# Patient Record
Sex: Male | Born: 1950 | Race: Black or African American | Hispanic: No | Marital: Single | Smoking: Current every day smoker
Health system: Southern US, Community
[De-identification: ages and names within clinical notes are randomized; demographics above are authoritative.]

## PROBLEM LIST (undated history)

## (undated) DIAGNOSIS — F064 Anxiety disorder due to known physiological condition: Secondary | ICD-10-CM

## (undated) DIAGNOSIS — F1010 Alcohol abuse, uncomplicated: Secondary | ICD-10-CM

## (undated) DIAGNOSIS — S04049A Injury of visual cortex, unspecified eye, initial encounter: Secondary | ICD-10-CM

## (undated) DIAGNOSIS — R011 Cardiac murmur, unspecified: Secondary | ICD-10-CM

## (undated) DIAGNOSIS — I10 Essential (primary) hypertension: Secondary | ICD-10-CM

## (undated) DIAGNOSIS — M1710 Unilateral primary osteoarthritis, unspecified knee: Secondary | ICD-10-CM

## (undated) DIAGNOSIS — M62838 Other muscle spasm: Secondary | ICD-10-CM

## (undated) DIAGNOSIS — M179 Osteoarthritis of knee, unspecified: Secondary | ICD-10-CM

## (undated) DIAGNOSIS — Z720 Tobacco use: Secondary | ICD-10-CM

## (undated) DIAGNOSIS — G2581 Restless legs syndrome: Secondary | ICD-10-CM

## (undated) DIAGNOSIS — E669 Obesity, unspecified: Secondary | ICD-10-CM

## (undated) DIAGNOSIS — E785 Hyperlipidemia, unspecified: Secondary | ICD-10-CM

## (undated) DIAGNOSIS — I499 Cardiac arrhythmia, unspecified: Secondary | ICD-10-CM

## (undated) DIAGNOSIS — G8921 Chronic pain due to trauma: Secondary | ICD-10-CM

## (undated) DIAGNOSIS — R5383 Other fatigue: Secondary | ICD-10-CM

## (undated) DIAGNOSIS — M25561 Pain in right knee: Secondary | ICD-10-CM

## (undated) DIAGNOSIS — Z630 Problems in relationship with spouse or partner: Secondary | ICD-10-CM

## (undated) DIAGNOSIS — R7989 Other specified abnormal findings of blood chemistry: Secondary | ICD-10-CM

## (undated) HISTORY — DX: Injury of visual cortex, unspecified side, initial encounter: S04.049A

## (undated) HISTORY — DX: Problems in relationship with spouse or partner: Z63.0

## (undated) HISTORY — DX: Cardiac arrhythmia, unspecified: I49.9

## (undated) HISTORY — DX: Osteoarthritis of knee, unspecified: M17.9

## (undated) HISTORY — DX: Tobacco use: Z72.0

## (undated) HISTORY — DX: Alcohol abuse, uncomplicated: F10.10

## (undated) HISTORY — DX: Restless legs syndrome: G25.81

## (undated) HISTORY — DX: Other muscle spasm: M62.838

## (undated) HISTORY — DX: Anxiety disorder due to known physiological condition: F06.4

## (undated) HISTORY — DX: Obesity, unspecified: E66.9

## (undated) HISTORY — DX: Cardiac murmur, unspecified: R01.1

## (undated) HISTORY — DX: Pain in right knee: M25.561

## (undated) HISTORY — DX: Hyperlipidemia, unspecified: E78.5

## (undated) HISTORY — DX: Other fatigue: R53.83

## (undated) HISTORY — DX: Chronic pain due to trauma: G89.21

## (undated) HISTORY — DX: Unilateral primary osteoarthritis, unspecified knee: M17.10

## (undated) HISTORY — DX: Other specified abnormal findings of blood chemistry: R79.89

---

## 2003-09-27 ENCOUNTER — Emergency Department (HOSPITAL_COMMUNITY): Admission: EM | Admit: 2003-09-27 | Discharge: 2003-09-27 | Payer: Self-pay | Admitting: Emergency Medicine

## 2012-06-07 ENCOUNTER — Ambulatory Visit (HOSPITAL_COMMUNITY)
Admission: RE | Admit: 2012-06-07 | Discharge: 2012-06-07 | Disposition: A | Discharge status: Home / Self Care | Payer: Self-pay | Source: Ambulatory Visit | Attending: Internal Medicine | Admitting: Internal Medicine

## 2012-06-07 ENCOUNTER — Other Ambulatory Visit (HOSPITAL_COMMUNITY): Payer: Self-pay | Admitting: Internal Medicine

## 2012-06-07 DIAGNOSIS — R0602 Shortness of breath: Secondary | ICD-10-CM | POA: Insufficient documentation

## 2013-03-23 ENCOUNTER — Emergency Department (HOSPITAL_COMMUNITY)
Admission: EM | Admit: 2013-03-23 | Discharge: 2013-03-23 | Disposition: A | Discharge status: Home / Self Care | Payer: Medicaid Other | Attending: Emergency Medicine | Admitting: Emergency Medicine

## 2013-03-23 ENCOUNTER — Encounter (HOSPITAL_COMMUNITY): Payer: Self-pay | Admitting: *Deleted

## 2013-03-23 DIAGNOSIS — I10 Essential (primary) hypertension: Secondary | ICD-10-CM | POA: Insufficient documentation

## 2013-03-23 DIAGNOSIS — S4980XA Other specified injuries of shoulder and upper arm, unspecified arm, initial encounter: Secondary | ICD-10-CM | POA: Insufficient documentation

## 2013-03-23 DIAGNOSIS — IMO0002 Reserved for concepts with insufficient information to code with codable children: Secondary | ICD-10-CM | POA: Insufficient documentation

## 2013-03-23 DIAGNOSIS — Y92410 Unspecified street and highway as the place of occurrence of the external cause: Secondary | ICD-10-CM | POA: Insufficient documentation

## 2013-03-23 DIAGNOSIS — S46909A Unspecified injury of unspecified muscle, fascia and tendon at shoulder and upper arm level, unspecified arm, initial encounter: Secondary | ICD-10-CM | POA: Insufficient documentation

## 2013-03-23 DIAGNOSIS — F17200 Nicotine dependence, unspecified, uncomplicated: Secondary | ICD-10-CM | POA: Insufficient documentation

## 2013-03-23 DIAGNOSIS — S8990XA Unspecified injury of unspecified lower leg, initial encounter: Secondary | ICD-10-CM | POA: Insufficient documentation

## 2013-03-23 DIAGNOSIS — Y9389 Activity, other specified: Secondary | ICD-10-CM | POA: Insufficient documentation

## 2013-03-23 HISTORY — DX: Essential (primary) hypertension: I10

## 2013-03-23 MED ORDER — CYCLOBENZAPRINE HCL 10 MG PO TABS: 10.0000 mg | ORAL_TABLET | Freq: Two times a day (BID) | ORAL | Status: DC | PRN

## 2013-03-23 MED ORDER — NAPROXEN 500 MG PO TABS: 500.0000 mg | ORAL_TABLET | Freq: Two times a day (BID) | ORAL | Status: DC
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
## 2013-03-24 NOTE — ED Provider Notes (Signed)
Medical screening examination/treatment/procedure(s) were performed by non-physician practitioner and as supervising physician I was immediately available for consultation/collaboration.   [name], MD 03/24/13 [phone]

## 2015-10-03 ENCOUNTER — Emergency Department (HOSPITAL_COMMUNITY)
Admission: EM | Admit: 2015-10-03 | Discharge: 2015-10-03 | Disposition: A | Discharge status: Home / Self Care | Payer: Medicare Other | Attending: Emergency Medicine | Admitting: Emergency Medicine

## 2015-10-03 ENCOUNTER — Encounter (HOSPITAL_COMMUNITY): Payer: Self-pay | Admitting: *Deleted

## 2015-10-03 ENCOUNTER — Emergency Department (HOSPITAL_COMMUNITY): Payer: Medicare Other

## 2015-10-03 DIAGNOSIS — Y998 Other external cause status: Secondary | ICD-10-CM | POA: Diagnosis not present

## 2015-10-03 DIAGNOSIS — I10 Essential (primary) hypertension: Secondary | ICD-10-CM | POA: Insufficient documentation

## 2015-10-03 DIAGNOSIS — S4992XA Unspecified injury of left shoulder and upper arm, initial encounter: Secondary | ICD-10-CM | POA: Insufficient documentation

## 2015-10-03 DIAGNOSIS — S060X0A Concussion without loss of consciousness, initial encounter: Secondary | ICD-10-CM | POA: Diagnosis not present

## 2015-10-03 DIAGNOSIS — Y92002 Bathroom of unspecified non-institutional (private) residence single-family (private) house as the place of occurrence of the external cause: Secondary | ICD-10-CM | POA: Insufficient documentation

## 2015-10-03 DIAGNOSIS — Z79899 Other long term (current) drug therapy: Secondary | ICD-10-CM | POA: Insufficient documentation

## 2015-10-03 DIAGNOSIS — Y9389 Activity, other specified: Secondary | ICD-10-CM | POA: Diagnosis not present

## 2015-10-03 DIAGNOSIS — S0990XA Unspecified injury of head, initial encounter: Secondary | ICD-10-CM | POA: Diagnosis present

## 2015-10-03 DIAGNOSIS — F17210 Nicotine dependence, cigarettes, uncomplicated: Secondary | ICD-10-CM | POA: Insufficient documentation

## 2015-10-03 DIAGNOSIS — Z791 Long term (current) use of non-steroidal anti-inflammatories (NSAID): Secondary | ICD-10-CM | POA: Insufficient documentation

## 2015-10-03 DIAGNOSIS — S161XXA Strain of muscle, fascia and tendon at neck level, initial encounter: Secondary | ICD-10-CM | POA: Insufficient documentation

## 2015-10-03 DIAGNOSIS — W19XXXA Unspecified fall, initial encounter: Secondary | ICD-10-CM

## 2015-10-03 DIAGNOSIS — W1812XA Fall from or off toilet with subsequent striking against object, initial encounter: Secondary | ICD-10-CM | POA: Diagnosis not present

## 2015-10-03 MED ORDER — CYCLOBENZAPRINE HCL 10 MG PO TABS: 10.0000 mg | ORAL_TABLET | Freq: Two times a day (BID) | ORAL | Status: DC | PRN

## 2015-10-03 MED ORDER — NAPROXEN 375 MG PO TABS: 375.0000 mg | ORAL_TABLET | Freq: Two times a day (BID) | ORAL | Status: DC

## 2015-10-03 MED ORDER — TRAMADOL HCL 50 MG PO TABS: 50.0000 mg | ORAL_TABLET | Freq: Two times a day (BID) | ORAL | Status: DC | PRN

## 2015-10-03 MED ORDER — CYCLOBENZAPRINE HCL 10 MG PO TABS: 10.0000 mg | ORAL_TABLET | Freq: Three times a day (TID) | ORAL | Status: DC | PRN

## 2015-10-03 NOTE — Discharge Instructions (Signed)
Concussion, Adult °A concussion, or closed-head injury, is a brain injury caused by a direct blow to the head or by a quick and sudden movement (jolt) of the head or neck. Concussions are usually not life-threatening. Even so, the effects of a concussion can be serious. If you have had a concussion before, you are more likely to experience concussion-like symptoms after a direct blow to the head.  °CAUSES °· Direct blow to the head, such as from running into another player during a soccer game, being hit in a fight, or hitting your head on a hard surface. °· A jolt of the head or neck that causes the brain to move back and forth inside the skull, such as in a car crash. °SIGNS AND SYMPTOMS °The signs of a concussion can be hard to notice. Early on, they may be missed by you, family members, and health care providers. You may look fine but act or feel differently. °Symptoms are usually temporary, but they may last for days, weeks, or even longer. Some symptoms may appear right away while others may not show up for hours or days. Every head injury is different. Symptoms include: °· Mild to moderate headaches that will not go away. °· A feeling of pressure inside your head. °· Having more trouble than usual: °¨ Learning or remembering things you have heard. °¨ Answering questions. °¨ Paying attention or concentrating. °¨ Organizing daily tasks. °¨ Making decisions and solving problems. °· Slowness in thinking, acting or reacting, speaking, or reading. °· Getting lost or being easily confused. °· Feeling tired all the time or lacking energy (fatigued). °· Feeling drowsy. °· Sleep disturbances. °¨ Sleeping more than usual. °¨ Sleeping less than usual. °¨ Trouble falling asleep. °¨ Trouble sleeping (insomnia). °· Loss of balance or feeling lightheaded or dizzy. °· Nausea or vomiting. °· Numbness or tingling. °· Increased sensitivity to: °¨ Sounds. °¨ Lights. °¨ Distractions. °· Vision problems or eyes that tire  easily. °· Diminished sense of taste or smell. °· Ringing in the ears. °· Mood changes such as feeling sad or anxious. °· Becoming easily irritated or angry for little or no reason. °· Lack of motivation. °· Seeing or hearing things other people do not see or hear (hallucinations). °DIAGNOSIS °Your health care provider can usually diagnose a concussion based on a description of your injury and symptoms. He or she will ask whether you passed out (lost consciousness) and whether you are having trouble remembering events that happened right before and during your injury. °Your evaluation might include: °· A brain scan to look for signs of injury to the brain. Even if the test shows no injury, you may still have a concussion. °· Blood tests to be sure other problems are not present. °TREATMENT °· Concussions are usually treated in an emergency department, in urgent care, or at a clinic. You may need to stay in the hospital overnight for further treatment. °· Tell your health care provider if you are taking any medicines, including prescription medicines, over-the-counter medicines, and natural remedies. Some medicines, such as blood thinners (anticoagulants) and aspirin, may increase the chance of complications. Also tell your health care provider whether you have had alcohol or are taking illegal drugs. This information may affect treatment. °· Your health care provider will send you home with important instructions to follow. °· How fast you will recover from a concussion depends on many factors. These factors include how severe your concussion is, what part of your brain was injured,   your age, and how healthy you were before the concussion. °· Most people with mild injuries recover fully. Recovery can take time. In general, recovery is slower in older persons. Also, persons who have had a concussion in the past or have other medical problems may find that it takes longer to recover from their current injury. °HOME  CARE INSTRUCTIONS °General Instructions °· Carefully follow the directions your health care provider gave you. °· Only take over-the-counter or prescription medicines for pain, discomfort, or fever as directed by your health care provider. °· Take only those medicines that your health care provider has approved. °· Do not drink alcohol until your health care provider says you are well enough to do so. Alcohol and certain other drugs may slow your recovery and can put you at risk of further injury. °· If it is harder than usual to remember things, write them down. °· If you are easily distracted, try to do one thing at a time. For example, do not try to watch TV while fixing dinner. °· Talk with family members or close friends when making important decisions. °· Keep all follow-up appointments. Repeated evaluation of your symptoms is recommended for your recovery. °· Watch your symptoms and tell others to do the same. Complications sometimes occur after a concussion. Older adults with a brain injury may have a higher risk of serious complications, such as a blood clot on the brain. °· Tell your teachers, school nurse, school counselor, coach, athletic trainer, or work manager about your injury, symptoms, and restrictions. Tell them about what you can or cannot do. They should watch for: °¨ Increased problems with attention or concentration. °¨ Increased difficulty remembering or learning new information. °¨ Increased time needed to complete tasks or assignments. °¨ Increased irritability or decreased ability to cope with stress. °¨ Increased symptoms. °· Rest. Rest helps the brain to heal. Make sure you: °¨ Get plenty of sleep at night. Avoid staying up late at night. °¨ Keep the same bedtime hours on weekends and weekdays. °¨ Rest during the day. Take daytime naps or rest breaks when you feel tired. °· Limit activities that require a lot of thought or concentration. These include: °¨ Doing homework or job-related  work. °¨ Watching TV. °¨ Working on the computer. °· Avoid any situation where there is potential for another head injury (football, hockey, soccer, basketball, martial arts, downhill snow sports and horseback riding). Your condition will get worse every time you experience a concussion. You should avoid these activities until you are evaluated by the appropriate follow-up health care providers. °Returning To Your Regular Activities °You will need to return to your normal activities slowly, not all at once. You must give your body and brain enough time for recovery. °· Do not return to sports or other athletic activities until your health care provider tells you it is safe to do so. °· Ask your health care provider when you can drive, ride a bicycle, or operate heavy machinery. Your ability to react may be slower after a brain injury. Never do these activities if you are dizzy. °· Ask your health care provider about when you can return to work or school. °Preventing Another Concussion °It is very important to avoid another brain injury, especially before you have recovered. In rare cases, another injury can lead to permanent brain damage, brain swelling, or death. The risk of this is greatest during the first 7-10 days after a head injury. Avoid injuries by: °· Wearing a   seat belt when riding in a car.  Drinking alcohol only in moderation.  Wearing a helmet when biking, skiing, skateboarding, skating, or doing similar activities.  Avoiding activities that could lead to a second concussion, such as contact or recreational sports, until your health care provider says it is okay.  Taking safety measures in your home.  Remove clutter and tripping hazards from floors and stairways.  Use grab bars in bathrooms and handrails by stairs.  Place non-slip mats on floors and in bathtubs.  Improve lighting in dim areas. SEEK MEDICAL CARE IF:  You have increased problems paying attention or  concentrating.  You have increased difficulty remembering or learning new information.  You need more time to complete tasks or assignments than before.  You have increased irritability or decreased ability to cope with stress.  You have more symptoms than before. Seek medical care if you have any of the following symptoms for more than 2 weeks after your injury:  Lasting (chronic) headaches.  Dizziness or balance problems.  Nausea.  Vision problems.  Increased sensitivity to noise or light.  Depression or mood swings.  Anxiety or irritability.  Memory problems.  Difficulty concentrating or paying attention.  Sleep problems.  Feeling tired all the time. SEEK IMMEDIATE MEDICAL CARE IF:  You have severe or worsening headaches. These may be a sign of a blood clot in the brain.  You have weakness (even if only in one hand, leg, or part of the face).  You have numbness.  You have decreased coordination.  You vomit repeatedly.  You have increased sleepiness.  One pupil is larger than the other.  You have convulsions.  You have slurred speech.  You have increased confusion. This may be a sign of a blood clot in the brain.  You have increased restlessness, agitation, or irritability.  You are unable to recognize people or places.  You have neck pain.  It is difficult to wake you up.  You have unusual behavior changes.  You lose consciousness. MAKE SURE YOU:  Understand these instructions.  Will watch your condition.  Will get help right away if you are not doing well or get worse.   This information is not intended to replace advice given to you by your health care provider. Make sure you discuss any questions you have with your health care provider.   Document Released: 11/15/2003 Document Revised: 09/15/2014 Document Reviewed: 03/17/2013 [employer] Interactive Patient Education 2016 [employer]  Muscle Strain A muscle strain is an injury that  occurs when a muscle is stretched beyond its normal length. Usually a small number of muscle fibers are torn when this happens. Muscle strain is rated in degrees. First-degree strains have the least amount of muscle fiber tearing and pain. Second-degree and third-degree strains have increasingly more tearing and pain.  Usually, recovery from muscle strain takes 1-2 weeks. Complete healing takes 5-6 weeks.  CAUSES  Muscle strain happens when a sudden, violent force placed on a muscle stretches it too far. This may occur with lifting, sports, or a fall.  RISK FACTORS Muscle strain is especially common in athletes.  SIGNS AND SYMPTOMS At the site of the muscle strain, there may be:  Pain.  Bruising.  Swelling.  Difficulty using the muscle due to pain or lack of normal function. DIAGNOSIS  Your health care provider will perform a physical exam and ask about your medical history. TREATMENT  Often, the best treatment for a muscle strain is resting, icing, and  applying cold compresses to the injured area.  HOME CARE INSTRUCTIONS   Use the PRICE method of treatment to promote muscle healing during the first 2-3 days after your injury. The PRICE method involves:  Protecting the muscle from being injured again.  Restricting your activity and resting the injured body part.  Icing your injury. To do this, put ice in a plastic bag. Place a towel between your skin and the bag. Then, apply the ice and leave it on from 15-20 minutes each hour. After the third day, switch to moist heat packs.  Apply compression to the injured area with a splint or elastic bandage. Be careful not to wrap it too tightly. This may interfere with blood circulation or increase swelling.  Elevate the injured body part above the level of your heart as often as you can.  Only take over-the-counter or prescription medicines for pain, discomfort, or fever as directed by your health care provider.  Warming up prior to  exercise helps to prevent future muscle strains. SEEK MEDICAL CARE IF:   You have increasing pain or swelling in the injured area.  You have numbness, tingling, or a significant loss of strength in the injured area. MAKE SURE YOU:   Understand these instructions.  Will watch your condition.  Will get help right away if you are not doing well or get worse.   This information is not intended to replace advice given to you by your health care provider. Make sure you discuss any questions you have with your health care provider.   Document Released: 08/25/2005 Document Revised: 06/15/2013 Document Reviewed: 03/24/2013 [employer] Interactive Patient Education [employer]
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
## 2015-10-03 NOTE — ED Notes (Signed)
He has been taking ibu  That helps

## 2017-01-22 IMAGING — CT CT HEAD W/O CM
2 series · 16 of 30 positions shown, 20 images · non-contrast
Comparison: None.

CLINICAL DATA: Pain with blurred vision following fall

EXAM:
CT HEAD WITHOUT CONTRAST
TECHNIQUE: Contiguous axial images were obtained from the base of the skull
through the vertex without intravenous contrast.

[Series 201: head w/o, idose (1) · axial · non-contrast · 0.43mm/px · z∈[+1009,+1144]mm · 13 of 33 slices shown, 17 images]
[im 3/33  brain]
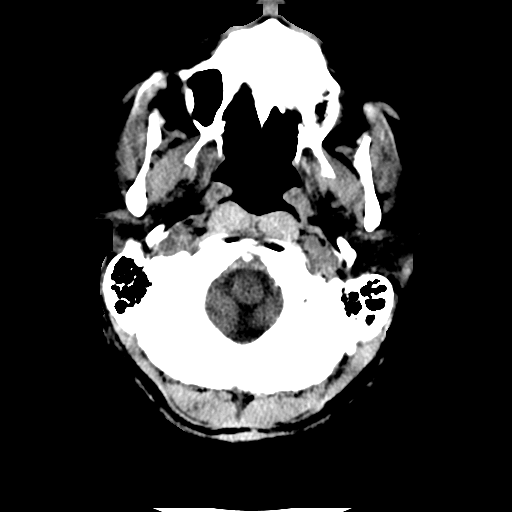
[im 3/33  bone]
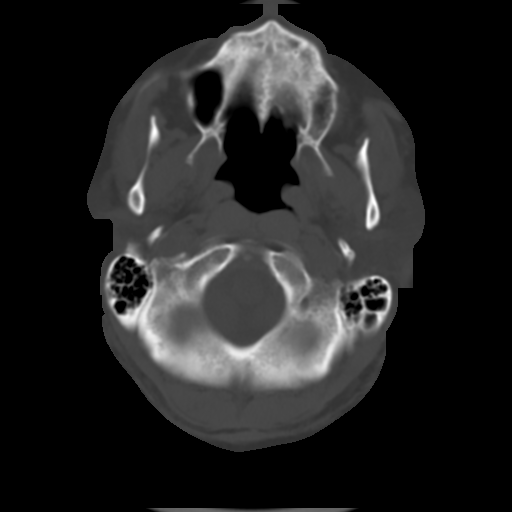
[im 5/33  brain]
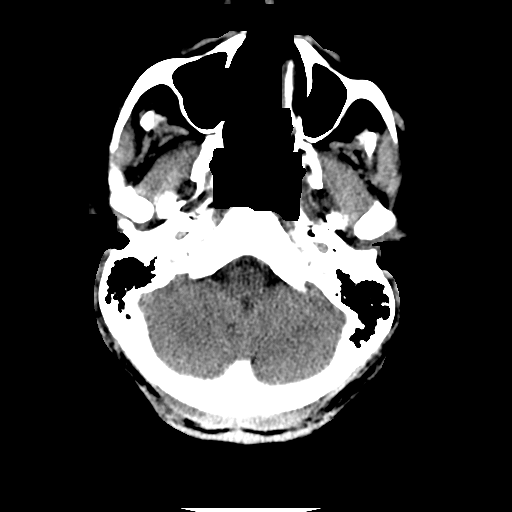
[im 7/33  brain]
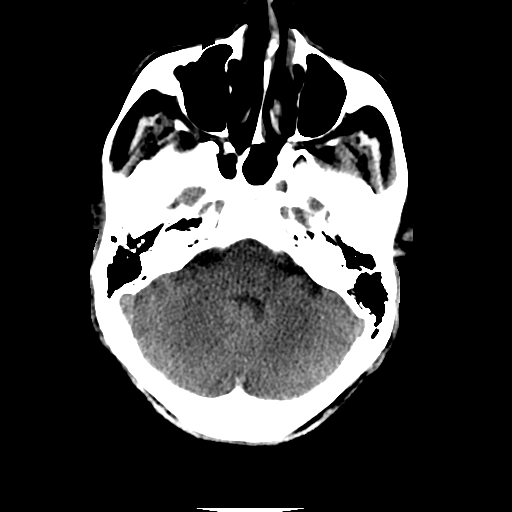
[im 10/33  brain]
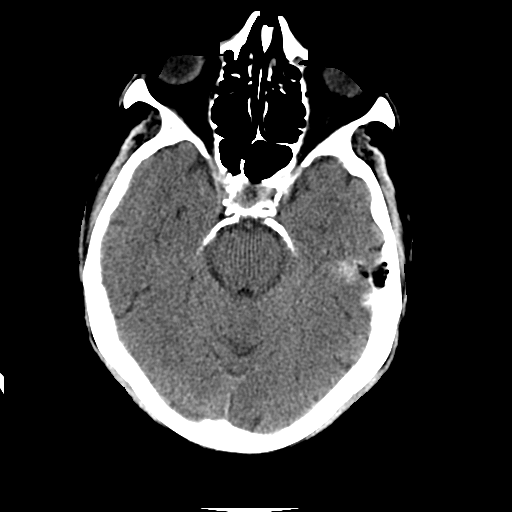
[im 12/33  brain]
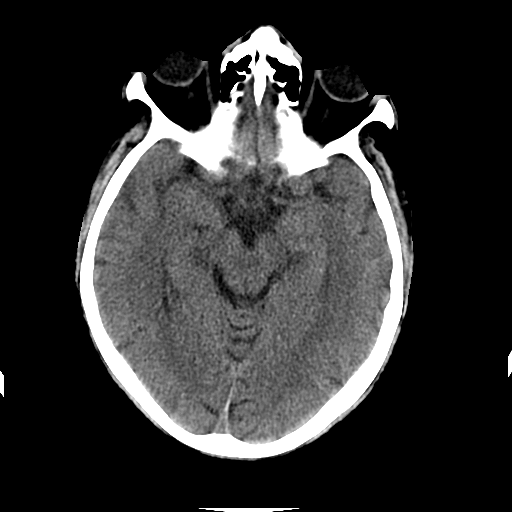
[im 12/33  bone]
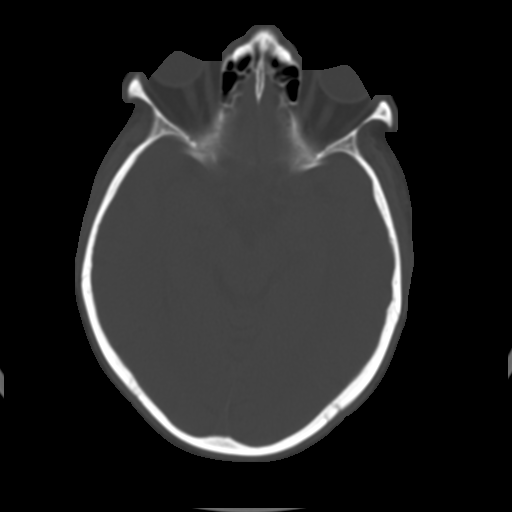
[im 14/33  brain]
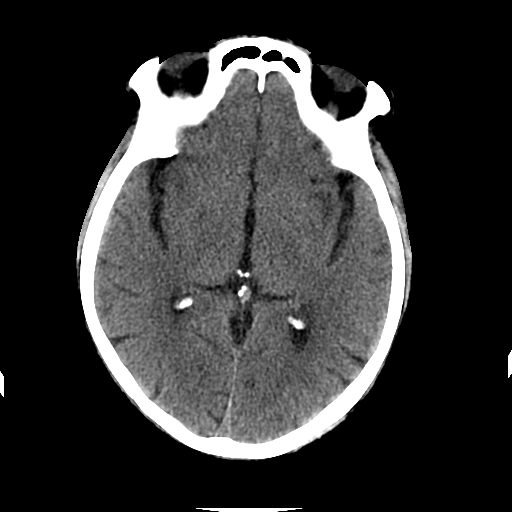
[im 17/33  brain]
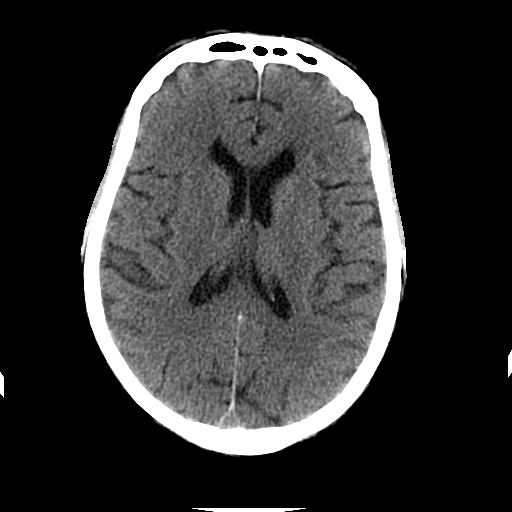
[im 19/33  brain]
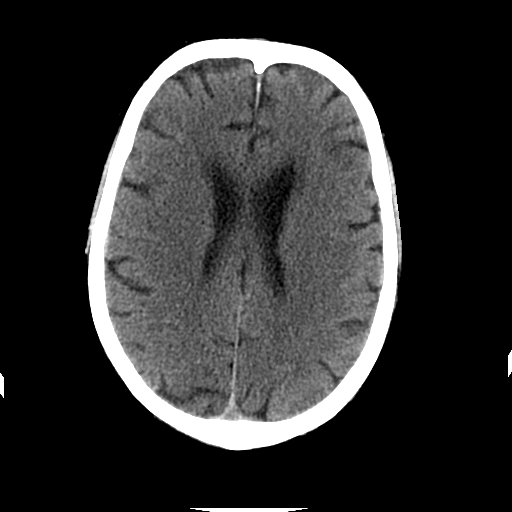
[im 21/33  brain]
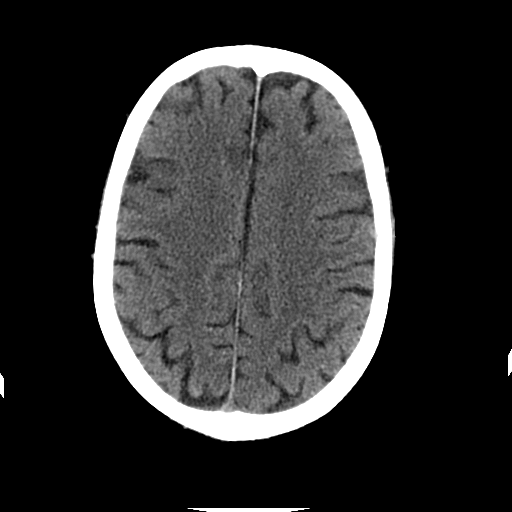
[im 21/33  bone]
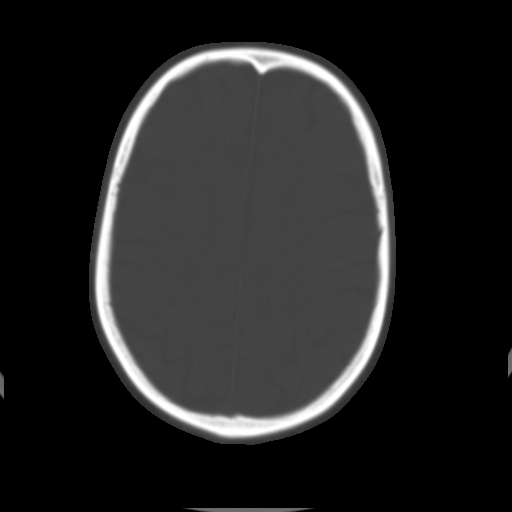
[im 23/33  brain]
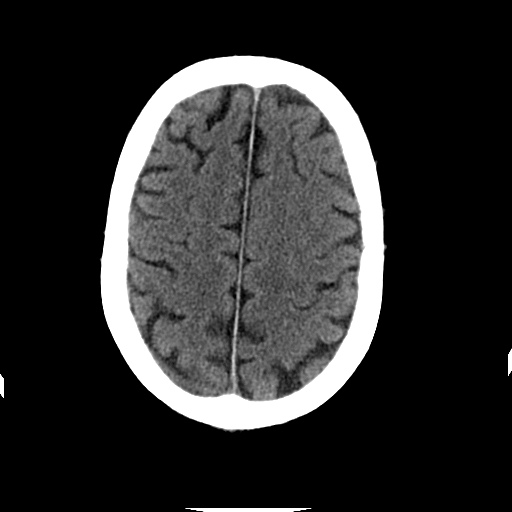
[im 26/33  brain]
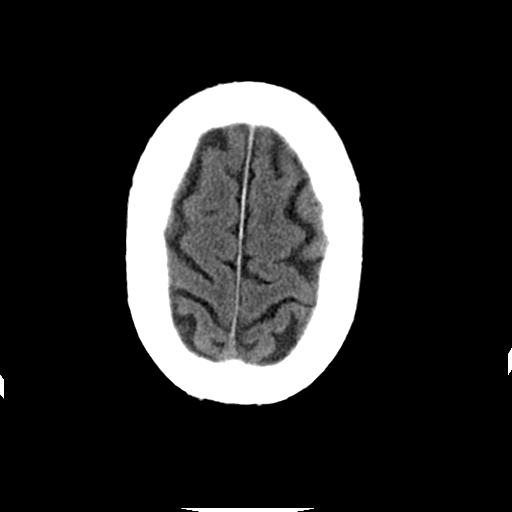
[im 28/33  brain]
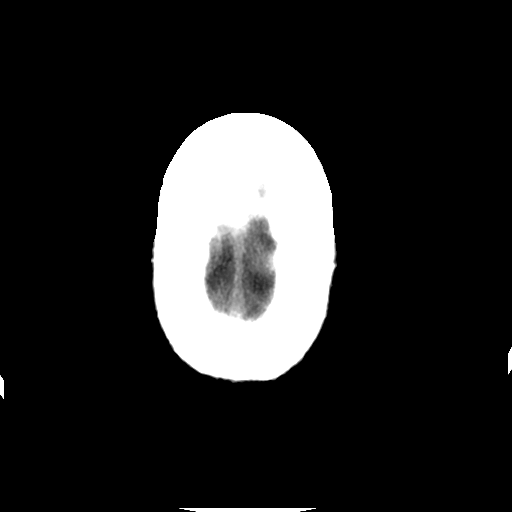
[im 30/33  brain]
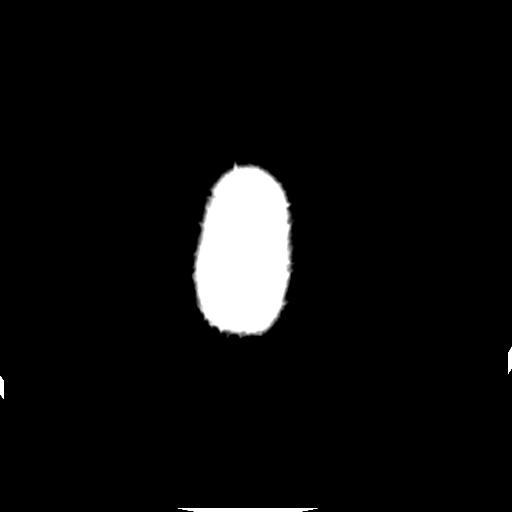
[im 30/33  bone]
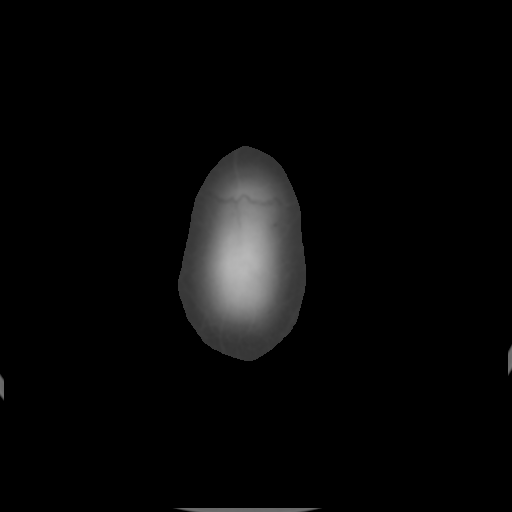

[Series 202: head w/o bone, idose (1) · axial · non-contrast · 0.43mm/px · z∈[+1009,+1054]mm · 3 of 33 slices shown]
[im 3/33  bone]
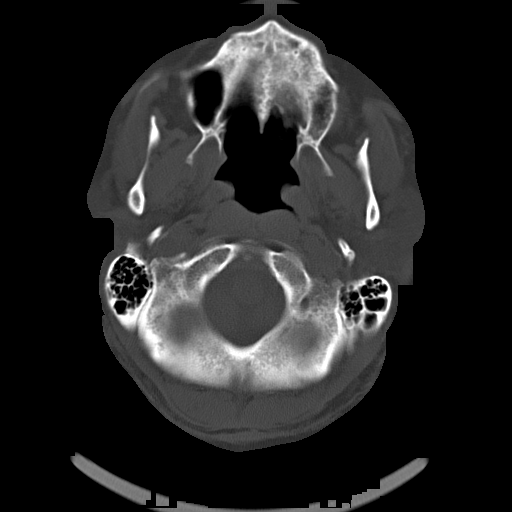
[im 7/33  bone]
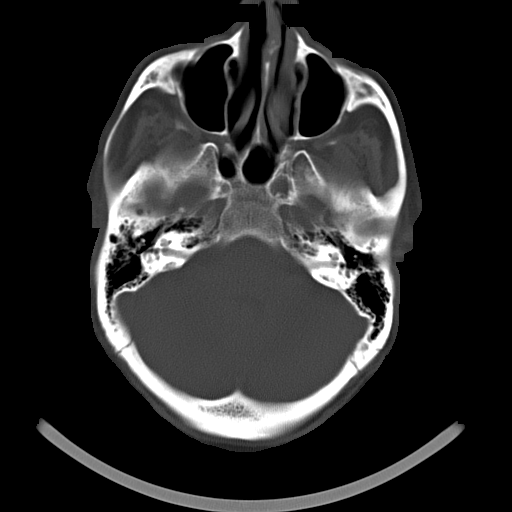
[im 12/33  bone]
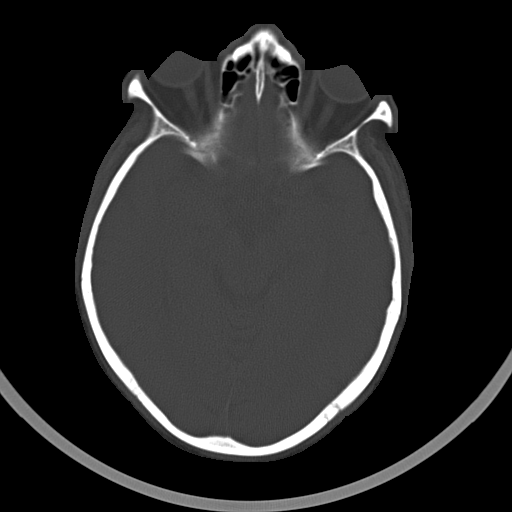

[16 of 30 positions shown; findings below may reference images not displayed]

FINDINGS: The ventricles are normal in size and configuration for age. There
is no intracranial mass, hemorrhage, extra-axial fluid collection,
or midline shift. There is minimal small vessel disease in the
centra semiovale bilaterally. Elsewhere, gray-white compartments
appear normal. No acute infarct evident. The bony calvarium appears
intact. The mastoid air cells are clear. No intraorbital lesions are
appreciated.
IMPRESSION: Minimal periventricular small vessel disease. No intracranial mass,
hemorrhage, or extra-axial fluid collection. No evidence of acute
infarct.

## 2017-10-20 ENCOUNTER — Emergency Department (HOSPITAL_COMMUNITY)
Admission: EM | Admit: 2017-10-20 | Discharge: 2017-10-20 | Disposition: A | Discharge status: Home / Self Care | Payer: Medicare Other | Attending: Emergency Medicine | Admitting: Emergency Medicine

## 2017-10-20 ENCOUNTER — Other Ambulatory Visit: Payer: Self-pay

## 2017-10-20 ENCOUNTER — Emergency Department (HOSPITAL_COMMUNITY): Payer: Medicare Other

## 2017-10-20 DIAGNOSIS — Y929 Unspecified place or not applicable: Secondary | ICD-10-CM | POA: Insufficient documentation

## 2017-10-20 DIAGNOSIS — S40022A Contusion of left upper arm, initial encounter: Secondary | ICD-10-CM | POA: Insufficient documentation

## 2017-10-20 DIAGNOSIS — Y9389 Activity, other specified: Secondary | ICD-10-CM | POA: Insufficient documentation

## 2017-10-20 DIAGNOSIS — Y998 Other external cause status: Secondary | ICD-10-CM | POA: Diagnosis not present

## 2017-10-20 DIAGNOSIS — Z23 Encounter for immunization: Secondary | ICD-10-CM | POA: Diagnosis not present

## 2017-10-20 DIAGNOSIS — S41132A Puncture wound without foreign body of left upper arm, initial encounter: Secondary | ICD-10-CM | POA: Diagnosis not present

## 2017-10-20 DIAGNOSIS — S4992XA Unspecified injury of left shoulder and upper arm, initial encounter: Secondary | ICD-10-CM | POA: Diagnosis present

## 2017-10-20 DIAGNOSIS — T148XXA Other injury of unspecified body region, initial encounter: Secondary | ICD-10-CM

## 2017-10-20 DIAGNOSIS — Z79899 Other long term (current) drug therapy: Secondary | ICD-10-CM | POA: Insufficient documentation

## 2017-10-20 DIAGNOSIS — F17210 Nicotine dependence, cigarettes, uncomplicated: Secondary | ICD-10-CM | POA: Insufficient documentation

## 2017-10-20 MED ORDER — TETANUS-DIPHTH-ACELL PERTUSSIS 5-2.5-18.5 LF-MCG/0.5 IM SUSP
0.5000 mL | Freq: Once | INTRAMUSCULAR | Status: AC
  Administered 2017-10-20: 0.5 mL via INTRAMUSCULAR
  Filled 2017-10-20: qty 0.5
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
## 2017-10-20 NOTE — ED Notes (Signed)
Wound care done and wrapped with an ace wrap for pressure dressing.

## 2017-10-20 NOTE — ED Notes (Signed)
CMS intact. Needs TDAP updated. Hypertensive, but non compliant with BP meds - states he takes them when he wants to. [employer] at bedside perfoming interview.
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
## 2017-12-06 ENCOUNTER — Ambulatory Visit (HOSPITAL_COMMUNITY)
Admission: EM | Admit: 2017-12-06 | Discharge: 2017-12-06 | Disposition: A | Discharge status: Home / Self Care | Payer: Medicare Other | Attending: Urgent Care | Admitting: Urgent Care

## 2017-12-06 ENCOUNTER — Encounter (HOSPITAL_COMMUNITY): Payer: Self-pay | Admitting: Family Medicine

## 2017-12-06 ENCOUNTER — Ambulatory Visit (INDEPENDENT_AMBULATORY_CARE_PROVIDER_SITE_OTHER): Payer: Medicare Other

## 2017-12-06 DIAGNOSIS — F17200 Nicotine dependence, unspecified, uncomplicated: Secondary | ICD-10-CM

## 2017-12-06 DIAGNOSIS — I10 Essential (primary) hypertension: Secondary | ICD-10-CM

## 2017-12-06 DIAGNOSIS — M7660 Achilles tendinitis, unspecified leg: Secondary | ICD-10-CM

## 2017-12-06 DIAGNOSIS — M25571 Pain in right ankle and joints of right foot: Secondary | ICD-10-CM | POA: Diagnosis not present

## 2017-12-06 DIAGNOSIS — S93401A Sprain of unspecified ligament of right ankle, initial encounter: Secondary | ICD-10-CM

## 2017-12-06 DIAGNOSIS — R030 Elevated blood-pressure reading, without diagnosis of hypertension: Secondary | ICD-10-CM

## 2017-12-06 MED ORDER — AMLODIPINE BESYLATE 5 MG PO TABS: 5.0000 mg | ORAL_TABLET | Freq: Every day | ORAL | 0 refills | Status: AC
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
## 2017-12-06 NOTE — Discharge Instructions (Addendum)
You may take 500mg  Tylenol every 6 hours for pain and inflammation. Check back with your PCP for continued management of your blood pressure.

## 2018-07-02 ENCOUNTER — Telehealth (HOSPITAL_COMMUNITY): Payer: Self-pay | Admitting: *Deleted

## 2018-07-02 NOTE — Telephone Encounter (Signed)
Left Tony Potter a msg asking him to return my call.

## 2018-07-06 ENCOUNTER — Encounter: Payer: Self-pay | Admitting: Vascular Surgery

## 2018-07-06 ENCOUNTER — Other Ambulatory Visit: Payer: Self-pay

## 2018-07-06 DIAGNOSIS — I739 Peripheral vascular disease, unspecified: Secondary | ICD-10-CM

## 2018-07-29 ENCOUNTER — Encounter: Payer: Self-pay | Admitting: Nurse Practitioner

## 2018-08-26 ENCOUNTER — Ambulatory Visit (HOSPITAL_COMMUNITY)
Admission: RE | Admit: 2018-08-26 | Discharge: 2018-08-26 | Disposition: A | Discharge status: Home / Self Care | Payer: Medicare Other | Source: Ambulatory Visit | Attending: Family | Admitting: Family

## 2018-08-26 ENCOUNTER — Encounter: Payer: Self-pay | Admitting: Vascular Surgery

## 2018-08-26 ENCOUNTER — Ambulatory Visit (INDEPENDENT_AMBULATORY_CARE_PROVIDER_SITE_OTHER): Payer: Medicare Other | Admitting: Vascular Surgery

## 2018-08-26 ENCOUNTER — Other Ambulatory Visit: Payer: Self-pay

## 2018-08-26 VITALS — BP 120/75 | HR 90 | Temp 97.0°F | Resp 20 | Ht 69.0 in | Wt 210.0 lb

## 2018-08-26 DIAGNOSIS — I739 Peripheral vascular disease, unspecified: Secondary | ICD-10-CM

## 2018-08-26 DIAGNOSIS — I6522 Occlusion and stenosis of left carotid artery: Secondary | ICD-10-CM

## 2018-08-26 NOTE — Progress Notes (Signed)
Referring Physician: Dr Nicolasa Duckingichard Pavelock  Patient name: Tony Potter MRN: [id number] DOB: [birth year] Sex: male  REASON FOR CONSULT: Bilateral lower extremity claudication  HPI: Tony Potter is a 67 y.o. male, with a several month history of declining walking distance.  The patient states that currently he can walk about 1 block before he experiences a tightness in both calves.  This relieves with rest.  He has no rest pain.  He has no ulceration.  He also has degenerative arthritis in his right knee and is somewhat limited by this.  He occasionally gets pain and swelling in the right knee.  He is able to go about 1 flight of stairs before becoming short of breath.  Chronic medical problems include hyperlipidemia tobacco abuse and hypertension.  He is trying to quit smoking.  He is currently on a statin.  He is not on aspirin.  He also has a history of erectile dysfunction and occasionally uses Viagra for this.  He also has a history of alcohol use and drinks about 3-4 times per week 2-3 drinks each time.    Past Medical History:  Diagnosis Date  . Alcohol abuse   . Anxiety disorder due to known physiological condition   . Cardiac arrhythmia   . Cardiac murmur   . Chronic pain due to trauma   . Fatigue   . Hyperlipidemia   . Hypertension   . Injury of visual cortex    Left eye, sequela  . Low testosterone   . Muscle spasm   . Osteoarthritis of knee   . Pain in right knee   . Problems in relationship with spouse or partner   . Tobacco abuse    No past surgical history on file.  Family History  Problem Relation Age of Onset  . Hypertension Mother   . Hypertension Father     SOCIAL HISTORY: Social History   Socioeconomic History  . Marital status: Single    Spouse name: Not on file  . Number of children: Not on file  . Years of education: Not on file  . Highest education level: Not on file  Occupational History  . Occupation: retired  Engineer, productionocial Needs  . Financial  resource strain: Not on file  . Food insecurity:    Worry: Not on file    Inability: Not on file  . Transportation needs:    Medical: Not on file    Non-medical: Not on file  Tobacco Use  . Smoking status: Current Every Day Smoker    Packs/day: 1.00    Years: 48.00    Pack years: 48.00    Types: Cigarettes  . Smokeless tobacco: Never Used  Substance and Sexual Activity  . Alcohol use: Yes    Comment: occ- Former Alcohol abuse  . Drug use: Not Currently  . Sexual activity: Not on file  Lifestyle  . Physical activity:    Days per week: Not on file    Minutes per session: Not on file  . Stress: Not on file  Relationships  . Social connections:    Talks on phone: Not on file    Gets together: Not on file    Attends religious service: Not on file    Active member of club or organization: Not on file    Attends meetings of clubs or organizations: Not on file    Relationship status: Not on file  . Intimate partner violence:    Fear of current or ex  partner: Not on file    Emotionally abused: Not on file    Physically abused: Not on file    Forced sexual activity: Not on file  Other Topics Concern  . Not on file  Social History Narrative  . Not on file    No Known Allergies  Current Outpatient Medications  Medication Sig Dispense Refill  . albuterol (PROVENTIL HFA;VENTOLIN HFA) 108 (90 BASE) MCG/ACT inhaler Inhale 2 puffs into the lungs every 6 (six) hours as needed for wheezing or shortness of breath.    Marland Kitchen amLODipine (NORVASC) 5 MG tablet Take 1 tablet (5 mg total) by mouth daily. 90 tablet 0  . atorvastatin (LIPITOR) 20 MG tablet TK 1 T PO D  2  . celecoxib (CELEBREX) 200 MG capsule Take 200 mg by mouth 2 (two) times daily.    . nitroGLYCERIN (NITROSTAT) 0.4 MG SL tablet Place 0.4 mg under the tongue every 5 (five) minutes as needed for chest pain.    Marland Kitchen escitalopram (LEXAPRO) 10 MG tablet Take 10 mg by mouth daily.    Marland Kitchen escitalopram (LEXAPRO) 20 MG tablet Take 20 mg by  mouth daily.    . ferrous sulfate 325 (65 FE) MG tablet Take 325 mg by mouth daily with breakfast.    . hydrALAZINE (APRESOLINE) 50 MG tablet Take 50 mg by mouth 3 (three) times daily.    Marland Kitchen losartan-hydrochlorothiazide (HYZAAR) 100-12.5 MG tablet Take 1 tablet by mouth daily.  4  . meloxicam (MOBIC) 15 MG tablet Take 15 mg by mouth daily.  3  . tiotropium (SPIRIVA) 18 MCG inhalation capsule Place 18 mcg into inhaler and inhale daily.     No current facility-administered medications for this visit.     ROS:   General:  No weight loss, Fever, chills  HEENT: No recent headaches, no nasal bleeding, no visual changes, no sore throat  Neurologic: No dizziness, blackouts, seizures. No recent symptoms of stroke or mini- stroke. No recent episodes of slurred speech, or temporary blindness.  Cardiac: No recent episodes of chest pain/pressure, no shortness of breath at rest.  +shortness of breath with exertion.  Denies history of atrial fibrillation or irregular heartbeat  Vascular: No history of rest pain in feet.  + history of claudication.  No history of non-healing ulcer, No history of DVT   Pulmonary: No home oxygen, no productive cough, no hemoptysis,  No asthma or wheezing  Musculoskeletal:  [X]  Arthritis, [ ]  Low back pain,  [X]  Joint pain  Hematologic:No history of hypercoagulable state.  No history of easy bleeding.  No history of anemia  Gastrointestinal: No hematochezia or melena,  No gastroesophageal reflux, no trouble swallowing  Urinary: [ ]  chronic Kidney disease, [ ]  on HD - [ ]  MWF or [ ]  TTHS, [ ]  Burning with urination, [ ]  Frequent urination, [ ]  Difficulty urinating;   Skin: No rashes  Psychological: No history of anxiety,  No history of depression   Physical Examination  Vitals:   08/26/18 0911  BP: 120/75  Pulse: 90  Resp: 20  Temp: (!) 97 F (36.1 C)  SpO2: 99%  Weight: 210 lb (95.3 kg)  Height: 5\' 9"  (1.753 m)    Body mass index is 31.01  kg/m.  General:  Alert and oriented, no acute distress HEENT: Normal Neck: Left side carotid bruit no JVD Pulmonary: Clear to auscultation bilaterally Cardiac: Regular Rate and Rhythm without murmur Abdomen: Soft, non-tender, non-distended, no mass, no scars Skin: No rash Extremity Pulses:  2+ radial, brachial, femoral, absent popliteal dorsalis pedis, posterior tibial pulses bilaterally Musculoskeletal: No deformity or edema  Neurologic: Upper and lower extremity motor 5/5 and symmetric  DATA:  Patient had bilateral ABIs which were 0.5.  I reviewed the study.  ASSESSMENT: #1 left side carotid bruit needs carotid duplex exam to make sure he does not have significant carotid stenosis.  He does not have any TIA amaurosis or stroke type symptoms.  2.  Peripheral arterial disease with short distance lower extremity claudication   PLAN: Patient was given instructions for a walking program of 30 minutes daily to try to improve his walking distance.  He will also try to quit smoking.  He will also start taking 1 aspirin daily for stroke and cardiac prophylaxis.  He will follow-up with me in 3 months time.  At that time we will obtain bilateral ABIs lower extremity arterial duplex and consider arteriogram if his symptoms are not improved.  We will also obtain bilateral carotid duplex scan to make sure he does not have a significant carotid stenosis.  Fabienne Brunsharles Shanina Kepple, MD [facility] Office: [phone] Pager: [phone]

## 2018-11-04 ENCOUNTER — Other Ambulatory Visit: Payer: Self-pay | Admitting: Internal Medicine

## 2018-11-04 DIAGNOSIS — F17200 Nicotine dependence, unspecified, uncomplicated: Secondary | ICD-10-CM

## 2018-11-08 ENCOUNTER — Other Ambulatory Visit: Payer: Self-pay

## 2018-11-08 ENCOUNTER — Ambulatory Visit: Payer: Medicare Other

## 2018-11-08 DIAGNOSIS — I739 Peripheral vascular disease, unspecified: Secondary | ICD-10-CM

## 2018-11-08 DIAGNOSIS — I6522 Occlusion and stenosis of left carotid artery: Secondary | ICD-10-CM

## 2018-11-11 ENCOUNTER — Encounter: Payer: Self-pay | Admitting: Family

## 2018-11-11 ENCOUNTER — Inpatient Hospital Stay (HOSPITAL_COMMUNITY): Admission: RE | Admit: 2018-11-11 | Payer: Medicare Other | Source: Ambulatory Visit

## 2018-12-02 ENCOUNTER — Encounter (HOSPITAL_COMMUNITY): Payer: Medicare Other

## 2018-12-02 ENCOUNTER — Ambulatory Visit: Payer: Medicare Other | Admitting: Vascular Surgery
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
## 2019-10-12 DIAGNOSIS — E669 Obesity, unspecified: Secondary | ICD-10-CM | POA: Diagnosis not present

## 2019-10-12 DIAGNOSIS — F1211 Cannabis abuse, in remission: Secondary | ICD-10-CM | POA: Diagnosis not present

## 2019-10-12 DIAGNOSIS — I10 Essential (primary) hypertension: Secondary | ICD-10-CM | POA: Diagnosis not present

## 2019-10-12 DIAGNOSIS — F419 Anxiety disorder, unspecified: Secondary | ICD-10-CM | POA: Diagnosis not present

## 2019-10-12 DIAGNOSIS — F1420 Cocaine dependence, uncomplicated: Secondary | ICD-10-CM | POA: Diagnosis not present

## 2019-10-12 DIAGNOSIS — Z008 Encounter for other general examination: Secondary | ICD-10-CM | POA: Diagnosis not present

## 2019-10-12 DIAGNOSIS — Z6833 Body mass index (BMI) 33.0-33.9, adult: Secondary | ICD-10-CM | POA: Diagnosis not present

## 2019-10-12 DIAGNOSIS — F17210 Nicotine dependence, cigarettes, uncomplicated: Secondary | ICD-10-CM | POA: Diagnosis not present

## 2019-10-21 DIAGNOSIS — F419 Anxiety disorder, unspecified: Secondary | ICD-10-CM | POA: Diagnosis not present

## 2019-10-28 DIAGNOSIS — F419 Anxiety disorder, unspecified: Secondary | ICD-10-CM | POA: Diagnosis not present

## 2020-03-14 ENCOUNTER — Institutional Professional Consult (permissible substitution): Payer: Medicare Other | Admitting: Critical Care Medicine

## 2020-03-14 ENCOUNTER — Encounter: Payer: Self-pay | Admitting: Critical Care Medicine

## 2020-03-14 NOTE — Progress Notes (Deleted)
Synopsis: Referred in July 2021 for COPD by Karl Ito, DO.  Subjective:   PATIENT ID: Tony Potter GENDER: male DOB: Jan 06, 1951, MRN: [id number]  No chief complaint on file.   HPI  Calf claudication within 1 block in 2019 Of breath within 1 flight of stairs  Tobacco abuse-working on quitting smoking Smokes MJ Cocaine?  COPD-diagnosed when? Albuterol-how often? Symbicort  Shortness of breath Cough Sputum Wheezing Can walk how far?  Family history of lung disease?  Vaccinations: Flu 05/2019 covid- Gala Murdoch in March - April Pneumonia- Pneumovaax 10/2018, prevnar ***  Past Medical History:  Diagnosis Date  . Alcohol abuse   . Anxiety disorder due to known physiological condition   . Cardiac arrhythmia   . Cardiac murmur   . Chronic pain due to trauma   . Fatigue   . Hyperlipidemia   . Hypertension   . Injury of visual cortex    Left eye, sequela  . Low testosterone   . Muscle spasm   . Obesity   . Osteoarthritis of knee   . Pain in right knee   . Problems in relationship with spouse or partner   . Restless leg syndrome   . Tobacco abuse      Family History  Problem Relation Age of Onset  . Hypertension Mother   . Hypertension Father   . Heart attack Father   . Diabetes Brother      No past surgical history on file.  Social History   Socioeconomic History  . Marital status: Single    Spouse name: Not on file  . Number of children: Not on file  . Years of education: Not on file  . Highest education level: Not on file  Occupational History  . Occupation: retired  Tobacco Use  . Smoking status: Current Every Day Smoker    Packs/day: 1.00    Years: 48.00    Pack years: 48.00    Types: Cigarettes  . Smokeless tobacco: Never Used  Vaping Use  . Vaping Use: Never used  Substance and Sexual Activity  . Alcohol use: Yes    Comment: occ- Former Alcohol abuse  . Drug use: Not Currently    Comment: former MJ, cocaine  . Sexual activity:  Not on file  Other Topics Concern  . Not on file  Social History Narrative  . Not on file   Social Determinants of Health   Financial Resource Strain:   . Difficulty of Paying Living Expenses:   Food Insecurity:   . Worried About Programme researcher, broadcasting/film/video in the Last Year:   . Barista in the Last Year:   Transportation Needs:   . Freight forwarder (Medical):   Marland Kitchen Lack of Transportation (Non-Medical):   Physical Activity:   . Days of Exercise per Week:   . Minutes of Exercise per Session:   Stress:   . Feeling of Stress :   Social Connections:   . Frequency of Communication with Friends and Family:   . Frequency of Social Gatherings with Friends and Family:   . Attends Religious Services:   . Active Member of Clubs or Organizations:   . Attends Banker Meetings:   Marland Kitchen Marital Status:   Intimate Partner Violence:   . Fear of Current or Ex-Partner:   . Emotionally Abused:   Marland Kitchen Physically Abused:   . Sexually Abused:      No Known Allergies   Immunization History  Administered Date(s) Administered  .  Tdap 10/20/2017    Outpatient Medications Prior to Visit  Medication Sig Dispense Refill  . albuterol (PROVENTIL HFA;VENTOLIN HFA) 108 (90 BASE) MCG/ACT inhaler Inhale 2 puffs into the lungs every 6 (six) hours as needed for wheezing or shortness of breath.    Marland Kitchen amLODipine (NORVASC) 5 MG tablet Take 1 tablet (5 mg total) by mouth daily. 90 tablet 0  . atorvastatin (LIPITOR) 20 MG tablet TK 1 T PO D  2  . celecoxib (CELEBREX) 200 MG capsule Take 200 mg by mouth 2 (two) times daily.    Marland Kitchen escitalopram (LEXAPRO) 10 MG tablet Take 10 mg by mouth daily.    Marland Kitchen escitalopram (LEXAPRO) 20 MG tablet Take 20 mg by mouth daily.    . ferrous sulfate 325 (65 FE) MG tablet Take 325 mg by mouth daily with breakfast.    . hydrALAZINE (APRESOLINE) 50 MG tablet Take 50 mg by mouth 3 (three) times daily.    Marland Kitchen losartan-hydrochlorothiazide (HYZAAR) 100-12.5 MG tablet Take 1 tablet  by mouth daily.  4  . meloxicam (MOBIC) 15 MG tablet Take 15 mg by mouth daily.  3  . nitroGLYCERIN (NITROSTAT) 0.4 MG SL tablet Place 0.4 mg under the tongue every 5 (five) minutes as needed for chest pain.    Marland Kitchen tiotropium (SPIRIVA) 18 MCG inhalation capsule Place 18 mcg into inhaler and inhale daily.     No facility-administered medications prior to visit.    ROS   Objective:  There were no vitals filed for this visit.   on *** LPM *** RA BMI Readings from Last 3 Encounters:  08/26/18 31.01 kg/m  10/20/17 31.01 kg/m  10/03/15 32.28 kg/m   Wt Readings from Last 3 Encounters:  08/26/18 210 lb (95.3 kg)  10/20/17 210 lb (95.3 kg)  10/03/15 212 lb 5 oz (96.3 kg)    Physical Exam   CBC No results found for: WBC, RBC, HGB, HCT, PLT, MCV, MCH, MCHC, RDW, LYMPHSABS, MONOABS, EOSABS, BASOSABS  CHEMISTRY No results for input(s): NA, K, CL, CO2, GLUCOSE, BUN, CREATININE, CALCIUM, MG, PHOS in the last 168 hours. CrCl cannot be calculated (No successful lab value found.).   Chest Imaging- films reviewed: CXR, 2 view 06/07/2012-degenerative spinal changes with disc narrowing, no opacities or masses.  Pulmonary Functions Testing Results: No flowsheet data found.      Assessment & Plan:   No diagnosis found.    Current Outpatient Medications:  .  albuterol (PROVENTIL HFA;VENTOLIN HFA) 108 (90 BASE) MCG/ACT inhaler, Inhale 2 puffs into the lungs every 6 (six) hours as needed for wheezing or shortness of breath., Disp: , Rfl:  .  amLODipine (NORVASC) 5 MG tablet, Take 1 tablet (5 mg total) by mouth daily., Disp: 90 tablet, Rfl: 0 .  atorvastatin (LIPITOR) 20 MG tablet, TK 1 T PO D, Disp: , Rfl: 2 .  celecoxib (CELEBREX) 200 MG capsule, Take 200 mg by mouth 2 (two) times daily., Disp: , Rfl:  .  escitalopram (LEXAPRO) 10 MG tablet, Take 10 mg by mouth daily., Disp: , Rfl:  .  escitalopram (LEXAPRO) 20 MG tablet, Take 20 mg by mouth daily., Disp: , Rfl:  .  ferrous sulfate  325 (65 FE) MG tablet, Take 325 mg by mouth daily with breakfast., Disp: , Rfl:  .  hydrALAZINE (APRESOLINE) 50 MG tablet, Take 50 mg by mouth 3 (three) times daily., Disp: , Rfl:  .  losartan-hydrochlorothiazide (HYZAAR) 100-12.5 MG tablet, Take 1 tablet by mouth daily., Disp: , Rfl:  4 .  meloxicam (MOBIC) 15 MG tablet, Take 15 mg by mouth daily., Disp: , Rfl: 3 .  nitroGLYCERIN (NITROSTAT) 0.4 MG SL tablet, Place 0.4 mg under the tongue every 5 (five) minutes as needed for chest pain., Disp: , Rfl:  .  tiotropium (SPIRIVA) 18 MCG inhalation capsule, Place 18 mcg into inhaler and inhale daily., Disp: , Rfl:    I spent *** minutes on this encounter, including face to face time and non-face to face time spent reviewing records, charting, coordinating care, etc.   Steffanie Dunn, DO [facility] 03/14/2020 11:46 AM

## 2020-04-23 ENCOUNTER — Institutional Professional Consult (permissible substitution): Payer: Medicare HMO | Admitting: Critical Care Medicine

## 2020-04-23 NOTE — Progress Notes (Deleted)
Synopsis: Referred in August 2021 for COPD by Karl Ito, DO.  Subjective:   PATIENT ID: Tony Potter GENDER: male DOB: May 20, 1951, MRN: [id number]  No chief complaint on file.   HPI    Past Medical History:  Diagnosis Date  . Alcohol abuse   . Anxiety disorder due to known physiological condition   . Cardiac arrhythmia   . Cardiac murmur   . Chronic pain due to trauma   . Fatigue   . Hyperlipidemia   . Hypertension   . Injury of visual cortex    Left eye, sequela  . Low testosterone   . Muscle spasm   . Obesity   . Osteoarthritis of knee   . Pain in right knee   . Problems in relationship with spouse or partner   . Restless leg syndrome   . Tobacco abuse      Family History  Problem Relation Age of Onset  . Hypertension Mother   . Hypertension Father   . Heart attack Father   . Diabetes Brother      No past surgical history on file.  Social History   Socioeconomic History  . Marital status: Single    Spouse name: Not on file  . Number of children: Not on file  . Years of education: Not on file  . Highest education level: Not on file  Occupational History  . Occupation: retired  Tobacco Use  . Smoking status: Current Every Day Smoker    Packs/day: 1.00    Years: 48.00    Pack years: 48.00    Types: Cigarettes  . Smokeless tobacco: Never Used  Vaping Use  . Vaping Use: Never used  Substance and Sexual Activity  . Alcohol use: Yes    Comment: occ- Former Alcohol abuse  . Drug use: Not Currently    Comment: former MJ, cocaine  . Sexual activity: Not on file  Other Topics Concern  . Not on file  Social History Narrative  . Not on file   Social Determinants of Health   Financial Resource Strain:   . Difficulty of Paying Living Expenses:   Food Insecurity:   . Worried About Programme researcher, broadcasting/film/video in the Last Year:   . Barista in the Last Year:   Transportation Needs:   . Freight forwarder (Medical):   Marland Kitchen Lack of  Transportation (Non-Medical):   Physical Activity:   . Days of Exercise per Week:   . Minutes of Exercise per Session:   Stress:   . Feeling of Stress :   Social Connections:   . Frequency of Communication with Friends and Family:   . Frequency of Social Gatherings with Friends and Family:   . Attends Religious Services:   . Active Member of Clubs or Organizations:   . Attends Banker Meetings:   Marland Kitchen Marital Status:   Intimate Partner Violence:   . Fear of Current or Ex-Partner:   . Emotionally Abused:   Marland Kitchen Physically Abused:   . Sexually Abused:      No Known Allergies   Immunization History  Administered Date(s) Administered  . Tdap 10/20/2017    Outpatient Medications Prior to Visit  Medication Sig Dispense Refill  . albuterol (PROVENTIL HFA;VENTOLIN HFA) 108 (90 BASE) MCG/ACT inhaler Inhale 2 puffs into the lungs every 6 (six) hours as needed for wheezing or shortness of breath.    Marland Kitchen amLODipine (NORVASC) 5 MG tablet Take 1 tablet (5 mg  total) by mouth daily. 90 tablet 0  . atorvastatin (LIPITOR) 20 MG tablet TK 1 T PO D  2  . celecoxib (CELEBREX) 200 MG capsule Take 200 mg by mouth 2 (two) times daily.    Marland Kitchen escitalopram (LEXAPRO) 10 MG tablet Take 10 mg by mouth daily.    Marland Kitchen escitalopram (LEXAPRO) 20 MG tablet Take 20 mg by mouth daily.    . ferrous sulfate 325 (65 FE) MG tablet Take 325 mg by mouth daily with breakfast.    . hydrALAZINE (APRESOLINE) 50 MG tablet Take 50 mg by mouth 3 (three) times daily.    Marland Kitchen losartan-hydrochlorothiazide (HYZAAR) 100-12.5 MG tablet Take 1 tablet by mouth daily.  4  . meloxicam (MOBIC) 15 MG tablet Take 15 mg by mouth daily.  3  . nitroGLYCERIN (NITROSTAT) 0.4 MG SL tablet Place 0.4 mg under the tongue every 5 (five) minutes as needed for chest pain.    Marland Kitchen tiotropium (SPIRIVA) 18 MCG inhalation capsule Place 18 mcg into inhaler and inhale daily.     No facility-administered medications prior to visit.    ROS   Objective:    There were no vitals filed for this visit.   on *** LPM *** RA BMI Readings from Last 3 Encounters:  08/26/18 31.01 kg/m  10/20/17 31.01 kg/m  10/03/15 32.28 kg/m   Wt Readings from Last 3 Encounters:  08/26/18 210 lb (95.3 kg)  10/20/17 210 lb (95.3 kg)  10/03/15 212 lb 5 oz (96.3 kg)    Physical Exam   CBC No results found for: WBC, RBC, HGB, HCT, PLT, MCV, MCH, MCHC, RDW, LYMPHSABS, MONOABS, EOSABS, BASOSABS  CHEMISTRY No results for input(s): NA, K, CL, CO2, GLUCOSE, BUN, CREATININE, CALCIUM, MG, PHOS in the last 168 hours. CrCl cannot be calculated (No successful lab value found.).   Chest Imaging- films reviewed: CXR, 2 view 06/07/2012-normal  Pulmonary Functions Testing Results: No flowsheet data found.      Assessment & Plan:     ICD-10-CM   1. Tobacco abuse  Z72.0       Current Outpatient Medications:  .  albuterol (PROVENTIL HFA;VENTOLIN HFA) 108 (90 BASE) MCG/ACT inhaler, Inhale 2 puffs into the lungs every 6 (six) hours as needed for wheezing or shortness of breath., Disp: , Rfl:  .  amLODipine (NORVASC) 5 MG tablet, Take 1 tablet (5 mg total) by mouth daily., Disp: 90 tablet, Rfl: 0 .  atorvastatin (LIPITOR) 20 MG tablet, TK 1 T PO D, Disp: , Rfl: 2 .  celecoxib (CELEBREX) 200 MG capsule, Take 200 mg by mouth 2 (two) times daily., Disp: , Rfl:  .  escitalopram (LEXAPRO) 10 MG tablet, Take 10 mg by mouth daily., Disp: , Rfl:  .  escitalopram (LEXAPRO) 20 MG tablet, Take 20 mg by mouth daily., Disp: , Rfl:  .  ferrous sulfate 325 (65 FE) MG tablet, Take 325 mg by mouth daily with breakfast., Disp: , Rfl:  .  hydrALAZINE (APRESOLINE) 50 MG tablet, Take 50 mg by mouth 3 (three) times daily., Disp: , Rfl:  .  losartan-hydrochlorothiazide (HYZAAR) 100-12.5 MG tablet, Take 1 tablet by mouth daily., Disp: , Rfl: 4 .  meloxicam (MOBIC) 15 MG tablet, Take 15 mg by mouth daily., Disp: , Rfl: 3 .  nitroGLYCERIN (NITROSTAT) 0.4 MG SL tablet, Place 0.4 mg under  the tongue every 5 (five) minutes as needed for chest pain., Disp: , Rfl:  .  tiotropium (SPIRIVA) 18 MCG inhalation capsule, Place 18 mcg  into inhaler and inhale daily., Disp: , Rfl:    I spent *** minutes on this encounter, including face to face time and non-face to face time spent reviewing records, charting, coordinating care, etc.   Steffanie Dunn, DO [facility] 04/23/2020 7:29 AM

## 2020-05-10 ENCOUNTER — Ambulatory Visit: Payer: Medicare HMO | Admitting: Cardiology

## 2020-05-28 ENCOUNTER — Institutional Professional Consult (permissible substitution): Payer: Medicare HMO | Admitting: Pulmonary Disease

## 2020-06-01 NOTE — Progress Notes (Deleted)
Cardiology Office Note:    Date:  06/01/2020   ID:  Tony Potter, DOB 02/02/1951, MRN [id number]  PCP:  Patient, No Pcp Per  [facility] Cardiologist:  Armanda Magic, MD  [facility] Electrophysiologist:  None   Referring MD: Vladimir Crofts, FNP    History of Present Illness:    Tony Potter is a 69 y.o. male with a hx of ETOH anuse, HTN, HLD, tobacco abuse, and PAD who was referred by Baruch Goldmann for claudication.  Patient has history of claudication where he developed tightness in both calves that was relieved with rest. No rest pain. No ulceration.    Past Medical History:  Diagnosis Date  . Alcohol abuse   . Anxiety disorder due to known physiological condition   . Cardiac arrhythmia   . Cardiac murmur   . Chronic pain due to trauma   . Fatigue   . Hyperlipidemia   . Hypertension   . Injury of visual cortex    Left eye, sequela  . Low testosterone   . Muscle spasm   . Obesity   . Osteoarthritis of knee   . Pain in right knee   . Problems in relationship with spouse or partner   . Restless leg syndrome   . Tobacco abuse     No past surgical history on file.  Current Medications: No outpatient medications have been marked as taking for the 06/04/20 encounter (Appointment) with Meriam Sprague, MD.     Allergies:   Patient has no known allergies.   Social History   Socioeconomic History  . Marital status: Single    Spouse name: Not on file  . Number of children: Not on file  . Years of education: Not on file  . Highest education level: Not on file  Occupational History  . Occupation: retired  Tobacco Use  . Smoking status: Current Every Day Smoker    Packs/day: 1.00    Years: 48.00    Pack years: 48.00    Types: Cigarettes  . Smokeless tobacco: Never Used  Vaping Use  . Vaping Use: Never used  Substance and Sexual Activity  . Alcohol use: Yes    Comment: occ- Former Alcohol abuse  . Drug use: Not Currently    Comment: former MJ,  cocaine  . Sexual activity: Not on file  Other Topics Concern  . Not on file  Social History Narrative  . Not on file   Social Determinants of Health   Financial Resource Strain:   . Difficulty of Paying Living Expenses: Not on file  Food Insecurity:   . Worried About Programme researcher, broadcasting/film/video in the Last Year: Not on file  . Ran Out of Food in the Last Year: Not on file  Transportation Needs:   . Lack of Transportation (Medical): Not on file  . Lack of Transportation (Non-Medical): Not on file  Physical Activity:   . Days of Exercise per Week: Not on file  . Minutes of Exercise per Session: Not on file  Stress:   . Feeling of Stress : Not on file  Social Connections:   . Frequency of Communication with Friends and Family: Not on file  . Frequency of Social Gatherings with Friends and Family: Not on file  . Attends Religious Services: Not on file  . Active Member of Clubs or Organizations: Not on file  . Attends Banker Meetings: Not on file  . Marital Status: Not on file  Family History: The patient's ***family history includes Diabetes in his brother; Heart attack in his father; Hypertension in his father and mother.  ROS:   Please see the history of present illness.    *** All other systems reviewed and are negative.  EKGs/Labs/Other Studies Reviewed:    The following studies were reviewed today: ABI 09/06/18: Summary:  Right: Resting right ankle-brachial index indicates moderate right lower extremity arterial disease. The right toe-brachial index is abnormal. RT great toe pressure = 62 mmHg.   Left: Resting left ankle-brachial index indicates moderate left lower extremity arterial disease. The left toe-brachial index is abnormal. LT Great toe pressure = 70 mmHg.   EKG:  EKG is *** ordered today.  The ekg ordered today demonstrates ***  Recent Labs: No results found for requested labs within last 8760 hours.  Recent Lipid Panel No results found for:  CHOL, TRIG, HDL, CHOLHDL, VLDL, LDLCALC, LDLDIRECT  Physical Exam:    VS:  There were no vitals taken for this visit.    Wt Readings from Last 3 Encounters:  08/26/18 210 lb (95.3 kg)  10/20/17 210 lb (95.3 kg)  10/03/15 212 lb 5 oz (96.3 kg)     GEN: *** Well nourished, well developed in no acute distress HEENT: Normal NECK: No JVD; No carotid bruits LYMPHATICS: No lymphadenopathy CARDIAC: ***RRR, no murmurs, rubs, gallops RESPIRATORY:  Clear to auscultation without rales, wheezing or rhonchi  ABDOMEN: Soft, non-tender, non-distended MUSCULOSKELETAL:  No edema; No deformity  SKIN: Warm and dry NEUROLOGIC:  Alert and oriented x 3 PSYCHIATRIC:  Normal affect   ASSESSMENT:    No diagnosis found. PLAN:    In order of problems listed above:  #PAD -Check ABIs -ASA, statin -Graded exercise program -Tobacco cessation  #HTN:  #HLD:  #Tobacco Use:   Medication Adjustments/Labs and Tests Ordered: Current medicines are reviewed at length with the patient today.  Concerns regarding medicines are outlined above.  No orders of the defined types were placed in this encounter.  No orders of the defined types were placed in this encounter.   There are no Patient Instructions on file for this visit.   Signed, Meriam Sprague, MD  06/01/2020 9:10 PM    [facility]

## 2020-06-04 ENCOUNTER — Ambulatory Visit: Payer: Medicare HMO | Admitting: Cardiology

## 2020-06-08 ENCOUNTER — Encounter: Payer: Self-pay | Admitting: General Practice

## 2021-01-02 ENCOUNTER — Encounter (HOSPITAL_COMMUNITY): Payer: Self-pay | Admitting: Emergency Medicine

## 2021-01-02 ENCOUNTER — Emergency Department (HOSPITAL_COMMUNITY)
Admission: EM | Admit: 2021-01-02 | Discharge: 2021-01-02 | Disposition: A | Discharge status: Home / Self Care | Payer: Medicare HMO | Attending: Emergency Medicine | Admitting: Emergency Medicine

## 2021-01-02 DIAGNOSIS — R8281 Pyuria: Secondary | ICD-10-CM | POA: Diagnosis not present

## 2021-01-02 DIAGNOSIS — F12129 Cannabis abuse with intoxication, unspecified: Secondary | ICD-10-CM | POA: Diagnosis not present

## 2021-01-02 DIAGNOSIS — Z79899 Other long term (current) drug therapy: Secondary | ICD-10-CM | POA: Diagnosis not present

## 2021-01-02 DIAGNOSIS — F17210 Nicotine dependence, cigarettes, uncomplicated: Secondary | ICD-10-CM | POA: Diagnosis not present

## 2021-01-02 DIAGNOSIS — I10 Essential (primary) hypertension: Secondary | ICD-10-CM | POA: Diagnosis not present

## 2021-01-02 DIAGNOSIS — F12929 Cannabis use, unspecified with intoxication, unspecified: Secondary | ICD-10-CM

## 2021-01-02 DIAGNOSIS — R42 Dizziness and giddiness: Secondary | ICD-10-CM | POA: Insufficient documentation

## 2021-01-02 LAB — CBC WITH DIFFERENTIAL/PLATELET
Abs Immature Granulocytes: 0.03 10*3/uL (ref 0.00–0.07)
Basophils Absolute: 0 10*3/uL (ref 0.0–0.1)
Basophils Relative: 0 %
Eosinophils Absolute: 0.1 10*3/uL (ref 0.0–0.5)
Eosinophils Relative: 1 %
HCT: 34.2 % — ABNORMAL LOW (ref 39.0–52.0)
Hemoglobin: 11.2 g/dL — ABNORMAL LOW (ref 13.0–17.0)
Immature Granulocytes: 1 %
Lymphocytes Relative: 20 %
Lymphs Abs: 1.2 10*3/uL (ref 0.7–4.0)
MCH: 27.7 pg (ref 26.0–34.0)
MCHC: 32.7 g/dL (ref 30.0–36.0)
MCV: 84.4 fL (ref 80.0–100.0)
Monocytes Absolute: 0.6 10*3/uL (ref 0.1–1.0)
Monocytes Relative: 11 %
Neutro Abs: 3.9 10*3/uL (ref 1.7–7.7)
Neutrophils Relative %: 67 %
Platelets: 289 10*3/uL (ref 150–400)
RBC: 4.05 MIL/uL — ABNORMAL LOW (ref 4.22–5.81)
RDW: 17.1 % — ABNORMAL HIGH (ref 11.5–15.5)
WBC: 5.7 10*3/uL (ref 4.0–10.5)
nRBC: 0 % (ref 0.0–0.2)

## 2021-01-02 LAB — COMPREHENSIVE METABOLIC PANEL
ALT: 22 U/L (ref 0–44)
AST: 23 U/L (ref 15–41)
Albumin: 3.6 g/dL (ref 3.5–5.0)
Alkaline Phosphatase: 69 U/L (ref 38–126)
Anion gap: 10 (ref 5–15)
BUN: 11 mg/dL (ref 8–23)
CO2: 26 mmol/L (ref 22–32)
Calcium: 8.9 mg/dL (ref 8.9–10.3)
Chloride: 100 mmol/L (ref 98–111)
Creatinine, Ser: 1.26 mg/dL — ABNORMAL HIGH (ref 0.61–1.24)
GFR, Estimated: >60 mL/min (ref >60)
Glucose, Bld: 143 mg/dL — ABNORMAL HIGH (ref 70–99)
Potassium: 3.1 mmol/L — ABNORMAL LOW (ref 3.5–5.1)
Sodium: 136 mmol/L (ref 135–145)
Total Bilirubin: 0.5 mg/dL (ref 0.3–1.2)
Total Protein: 6.9 g/dL (ref 6.5–8.1)

## 2021-01-02 LAB — RAPID URINE DRUG SCREEN, HOSP PERFORMED
Amphetamines: NOT DETECTED
Barbiturates: NOT DETECTED
Benzodiazepines: NOT DETECTED
Cocaine: POSITIVE — AB
Opiates: NOT DETECTED
Tetrahydrocannabinol: POSITIVE — AB

## 2021-01-02 LAB — URINALYSIS, ROUTINE W REFLEX MICROSCOPIC
Bilirubin Urine: NEGATIVE
Glucose, UA: NEGATIVE mg/dL
Hgb urine dipstick: NEGATIVE
Ketones, ur: NEGATIVE mg/dL
Nitrite: NEGATIVE
Protein, ur: NEGATIVE mg/dL
Specific Gravity, Urine: 1.014 (ref 1.005–1.030)
pH: 5 (ref 5.0–8.0)

## 2021-01-02 MED ORDER — CEFTRIAXONE SODIUM 500 MG IJ SOLR
500.0000 mg | Freq: Once | INTRAMUSCULAR | Status: AC
  Administered 2021-01-02: 500 mg via INTRAMUSCULAR
  Filled 2021-01-02: qty 500

## 2021-01-02 MED ORDER — LIDOCAINE HCL (PF) 1 % IJ SOLN
INTRAMUSCULAR | Status: AC
  Filled 2021-01-02: qty 5

## 2021-01-02 NOTE — ED Notes (Signed)
All appropriate discharge materials reviewed at length with patient. Time for questions provided. Pt has no other questions at this time and verbalizes understanding of all provided materials.  

## 2021-01-02 NOTE — ED Provider Notes (Signed)
[facility] EMERGENCY DEPARTMENT Provider Note   CSN: [id number] Arrival date & time: 01/02/21  1548     History No chief complaint on file.   Tony Potter is a 70 y.o. male.  70 y/o male with hx of HTN, HLD, ETOH abuse, obesity presents to the ED for evaluation of lightheadedness. Patient's symptoms began about 1.5 hours after ingesting a CBD gummy candy he purchased at a corner store when buying his cigarettes. He reports feeling "woozy", anxious, and disoriented. Endorses some visual hallucinations. Overall, his symptoms are slowly subsiding and he is feeling like he is now back to his baseline. He denies ETOH use today. Did smoke crack cocaine last night; uses this from time to time. Denies syncope, chest pain, SOB, N/V.  The history is provided by the patient. No language interpreter was used.       Past Medical History:  Diagnosis Date  . Alcohol abuse   . Anxiety disorder due to known physiological condition   . Cardiac arrhythmia   . Cardiac murmur   . Chronic pain due to trauma   . Fatigue   . Hyperlipidemia   . Hypertension   . Injury of visual cortex    Left eye, sequela  . Low testosterone   . Muscle spasm   . Obesity   . Osteoarthritis of knee   . Pain in right knee   . Problems in relationship with spouse or partner   . Restless leg syndrome   . Tobacco abuse     There are no problems to display for this patient.   No past surgical history on file.     Family History  Problem Relation Age of Onset  . Hypertension Mother   . Hypertension Father   . Heart attack Father   . Diabetes Brother     Social History   Tobacco Use  . Smoking status: Current Every Day Smoker    Packs/day: 1.00    Years: 48.00    Pack years: 48.00    Types: Cigarettes  . Smokeless tobacco: Never Used  Vaping Use  . Vaping Use: Never used  Substance Use Topics  . Alcohol use: Yes    Comment: occ- Former Alcohol abuse  . Drug use: Not  Currently    Comment: former MJ, cocaine    Home Medications Prior to Admission medications   Medication Sig Start Date End Date Taking? Authorizing Provider  albuterol (PROVENTIL HFA;VENTOLIN HFA) 108 (90 BASE) MCG/ACT inhaler Inhale 2 puffs into the lungs every 6 (six) hours as needed for wheezing or shortness of breath.    [provider]  amLODipine (NORVASC) 5 MG tablet Take 1 tablet (5 mg total) by mouth daily. 12/06/17   Wallis Bamberg, PA-C  atorvastatin (LIPITOR) 20 MG tablet TK 1 T PO D 06/23/18   [provider]  celecoxib (CELEBREX) 200 MG capsule Take 200 mg by mouth 2 (two) times daily.    [provider]  escitalopram (LEXAPRO) 10 MG tablet Take 10 mg by mouth daily.    [provider]  escitalopram (LEXAPRO) 20 MG tablet Take 20 mg by mouth daily.    [provider]  ferrous sulfate 325 (65 FE) MG tablet Take 325 mg by mouth daily with breakfast.    [provider]  hydrALAZINE (APRESOLINE) 50 MG tablet Take 50 mg by mouth 3 (three) times daily.    [provider]  losartan-hydrochlorothiazide (HYZAAR) 100-12.5 MG tablet Take 1  tablet by mouth daily. 09/02/17   [provider]  meloxicam (MOBIC) 15 MG tablet Take 15 mg by mouth daily. 09/16/17   [provider]  nitroGLYCERIN (NITROSTAT) 0.4 MG SL tablet Place 0.4 mg under the tongue every 5 (five) minutes as needed for chest pain.    [provider]  tiotropium (SPIRIVA) 18 MCG inhalation capsule Place 18 mcg into inhaler and inhale daily.    [provider]    Allergies    Patient has no known allergies.  Review of Systems   Review of Systems  Ten systems reviewed and are negative for acute change, except as noted in the HPI.    Physical Exam Updated Vital Signs BP (!) 141/79 (BP Location: Right Arm)   Pulse 61   Temp 98.6 F (37 C) (Oral)   Resp 18   SpO2 96%   Physical Exam Vitals and nursing note reviewed.   Constitutional:      General: He is not in acute distress.    Appearance: He is well-developed. He is not diaphoretic.     Comments: Nontoxic appearing and in NAD  HENT:     Head: Normocephalic and atraumatic.     Right Ear: External ear normal.     Left Ear: External ear normal.     Mouth/Throat:     Mouth: Mucous membranes are moist.  Eyes:     General: No scleral icterus.    Extraocular Movements: Extraocular movements intact.     Conjunctiva/sclera: Conjunctivae normal.  Cardiovascular:     Rate and Rhythm: Normal rate and regular rhythm.     Pulses: Normal pulses.  Pulmonary:     Effort: Pulmonary effort is normal. No respiratory distress.     Comments: Respirations even and unlabored Musculoskeletal:        General: Normal range of motion.     Cervical back: Normal range of motion.  Skin:    General: Skin is warm and dry.     Coloration: Skin is not pale.     Findings: No erythema or rash.  Neurological:     Mental Status: He is alert and oriented to person, place, and time.     Comments: GCS 15. Speech is goal oriented. No cranial nerve deficits appreciated; symmetric eyebrow raise, no facial drooping, tongue midline. Patient has equal grip strength bilaterally with 5/5 strength against resistance in all major muscle groups bilaterally. Sensation to light touch intact. Patient moves extremities without ataxia. No pronator drift. Patient ambulatory with steady gait; baseline limp.  Psychiatric:        Behavior: Behavior normal.     ED Results / Procedures / Treatments   Labs (all labs ordered are listed, but only abnormal results are displayed) Labs Reviewed  COMPREHENSIVE METABOLIC PANEL - Abnormal; Notable for the following components:      Result Value   Potassium 3.1 (*)    Glucose, Bld 143 (*)    Creatinine, Ser 1.26 (*)    All other components within normal limits  CBC WITH DIFFERENTIAL/PLATELET - Abnormal; Notable for the following components:   RBC 4.05  (*)    Hemoglobin 11.2 (*)    HCT 34.2 (*)    RDW 17.1 (*)    All other components within normal limits  RAPID URINE DRUG SCREEN, HOSP PERFORMED - Abnormal; Notable for the following components:   Cocaine POSITIVE (*)    Tetrahydrocannabinol POSITIVE (*)    All other components within normal limits  URINALYSIS,  ROUTINE W REFLEX MICROSCOPIC - Abnormal; Notable for the following components:   APPearance CLOUDY (*)    Leukocytes,Ua MODERATE (*)    Bacteria, UA RARE (*)    All other components within normal limits  GC/CHLAMYDIA PROBE AMP ([place]) NOT AT [facility]    EKG EKG Interpretation  Date/Time:  Wednesday January 02 2021 16:16:03 EDT Ventricular Rate:  76 PR Interval:  164 QRS Duration: 90 QT Interval:  380 QTC Calculation: 427 R Axis:   20 Text Interpretation: Normal sinus rhythm Left ventricular hypertrophy with repolarization abnormality ( R in aVL ) Inferior infarct , age undetermined Abnormal ECG No old tracing to compare Confirmed by Eber Hong ([id number]) on 01/02/2021 7:52:24 PM   Radiology No results found.  Procedures Procedures   Medications Ordered in ED Medications  lidocaine (PF) (XYLOCAINE) 1 % injection (has no administration in time range)  cefTRIAXone (ROCEPHIN) injection 500 mg (500 mg Intramuscular Given 01/02/21 2019)    ED Course  I have reviewed the triage vital signs and the nursing notes.  Pertinent labs & imaging results that were available during my care of the patient were reviewed by me and considered in my medical decision making (see chart for details).    MDM Rules/Calculators/A&P                          70 y/o male presents with symptoms c/w marijuana intoxication after ingestion of CBD gummy earlier today. These symptoms are spontaneously improving without intervention. He has reassuring vital signs. Work up notable for UDS being positive for both marijuana and cocaine. Also with pyuria without bacteriuria, nitrites. Asymptomatic for  UTI. Question STI; treated with IM Rocephin prior to discharge. GC/Chlamydia cytology orders added.   Patient advised to d/c use of illicit drugs. He is stable for outpatient follow up with a primary care doctor.  Return precautions discussed and provided.  Patient discharged in stable condition with no unaddressed concerns.   Final Clinical Impression(s) / ED Diagnoses Final diagnoses:  Intoxication with marijuana, with unspecified complication [facility])  Pyuria    Rx / DC Orders ED Discharge Orders    None       Antony Madura, Cordelia Poche 01/02/21 2048    Eber Hong, MD 01/11/21 [phone]

## 2021-01-02 NOTE — ED Provider Notes (Signed)
Medical screening examination/treatment/procedure(s) were conducted as a shared visit with non-physician practitioner(s) and myself.  I personally evaluated the patient during the encounter.  Clinical Impression:   Final diagnoses:  Intoxication with marijuana, with unspecified complication (HCC)  Pyuria    Pt wit eating a CBD gummy - then felt off - and off balance and "woozy" - has normal heart rate and no murmurs - neuro intact on my exam, speech clear - labs unremarkable - EKG reassuring - Vs reassuring.   Eber Hong, MD 01/02/21 2005

## 2021-01-02 NOTE — Discharge Instructions (Signed)
We recommend that you discontinue the use of illicit substances such as marijuana and cocaine.  This is suspected to be the cause of your symptoms today.  Follow-up with a primary care doctor as needed.  Return to the ED for new or concerning symptoms.

## 2021-01-02 NOTE — ED Triage Notes (Signed)
Pt here from home with c/o dry mouth and dizziness after ingestion  of cbd oil

## 2021-01-02 NOTE — ED Triage Notes (Signed)
Emergency Medicine Provider Triage Evaluation Note  YUVAN MEDINGER , a 70 y.o. male  was evaluated in triage.  Pt complains of not feeling well after eating a whole CBD gummy today.  Review of Systems  Positive: Light headed Negative: CP, weakness, numbness  Physical Exam  BP (!) 124/58 (BP Location: Right Arm)   Pulse 74   Temp 98.6 F (37 C) (Oral)   Resp 16   SpO2 95%  Gen:   Awake, no distress   HEENT:  Atraumatic  Resp:  Normal effort  Cardiac:  Normal rate  Abd:   Nondistended, nontender  MSK:   Moves extremities without difficulty  Neuro:  Speech clear   Medical Decision Making  Medically screening exam initiated at 3:55 PM.  Appropriate orders placed.  KYRIE FLUDD was informed that the remainder of the evaluation will be completed by another provider, this initial triage assessment does not replace that evaluation, and the importance of remaining in the ED until their evaluation is complete.  Clinical Impression     Jeannie Fend, PA-C 01/02/21 1606

## 2021-08-19 NOTE — Progress Notes (Deleted)
Referring-Syed Sunday Corn MD Reason for referral-murmur, abnormal electrocardiogram and palpitations  HPI: 70 year old male for evaluation of murmur, pain abnormal electrocardiogram and palpitations at request of Velta Addison MD. ABIs December 2019 moderate on the right and left.  Laboratories June 25, 2021 showed total cholesterol 184 with LDL 102.  Current Outpatient Medications  Medication Sig Dispense Refill   albuterol (PROVENTIL HFA;VENTOLIN HFA) 108 (90 BASE) MCG/ACT inhaler Inhale 2 puffs into the lungs every 6 (six) hours as needed for wheezing or shortness of breath.     amLODipine (NORVASC) 5 MG tablet Take 1 tablet (5 mg total) by mouth daily. 90 tablet 0   atorvastatin (LIPITOR) 20 MG tablet TK 1 T PO D  2   celecoxib (CELEBREX) 200 MG capsule Take 200 mg by mouth 2 (two) times daily.     escitalopram (LEXAPRO) 10 MG tablet Take 10 mg by mouth daily.     escitalopram (LEXAPRO) 20 MG tablet Take 20 mg by mouth daily.     ferrous sulfate 325 (65 FE) MG tablet Take 325 mg by mouth daily with breakfast.     hydrALAZINE (APRESOLINE) 50 MG tablet Take 50 mg by mouth 3 (three) times daily.     losartan-hydrochlorothiazide (HYZAAR) 100-12.5 MG tablet Take 1 tablet by mouth daily.  4   meloxicam (MOBIC) 15 MG tablet Take 15 mg by mouth daily.  3   nitroGLYCERIN (NITROSTAT) 0.4 MG SL tablet Place 0.4 mg under the tongue every 5 (five) minutes as needed for chest pain.     tiotropium (SPIRIVA) 18 MCG inhalation capsule Place 18 mcg into inhaler and inhale daily.     No current facility-administered medications for this visit.    No Known Allergies   Past Medical History:  Diagnosis Date   Alcohol abuse    Anxiety disorder due to known physiological condition    Cardiac arrhythmia    Cardiac murmur    Chronic pain due to trauma    Fatigue    Hyperlipidemia    Hypertension    Injury of visual cortex    Left eye, sequela   Low testosterone    Muscle spasm    Obesity     Osteoarthritis of knee    Pain in right knee    Problems in relationship with spouse or partner    Restless leg syndrome    Tobacco abuse     No past surgical history on file.  Social History   Socioeconomic History   Marital status: Single    Spouse name: Not on file   Number of children: Not on file   Years of education: Not on file   Highest education level: Not on file  Occupational History   Occupation: retired  Tobacco Use   Smoking status: Every Day    Packs/day: 1.00    Years: 48.00    Pack years: 48.00    Types: Cigarettes   Smokeless tobacco: Never  Vaping Use   Vaping Use: Never used  Substance and Sexual Activity   Alcohol use: Yes    Comment: occ- Former Alcohol abuse   Drug use: Not Currently    Comment: former MJ, cocaine   Sexual activity: Not on file  Other Topics Concern   Not on file  Social History Narrative   Not on file   Social Determinants of Health   Financial Resource Strain: Not on file  Food Insecurity: Not on file  Transportation Needs: Not on file  Physical Activity: Not on file  Stress: Not on file  Social Connections: Not on file  Intimate Partner Violence: Not on file    Family History  Problem Relation Age of Onset   Hypertension Mother    Hypertension Father    Heart attack Father    Diabetes Brother     ROS: no fevers or chills, productive cough, hemoptysis, dysphasia, odynophagia, melena, hematochezia, dysuria, hematuria, rash, seizure activity, orthopnea, PND, pedal edema, claudication. Remaining systems are negative.  Physical Exam:   There were no vitals taken for this visit.  General:  Well developed/well nourished in NAD Skin warm/dry Patient not depressed No peripheral clubbing Back-normal HEENT-normal/normal eyelids Neck supple/normal carotid upstroke bilaterally; no bruits; no JVD; no thyromegaly chest - CTA/ normal expansion CV - RRR/normal S1 and S2; no murmurs, rubs or gallops;  PMI  nondisplaced Abdomen -NT/ND, no HSM, no mass, + bowel sounds, no bruit 2+ femoral pulses, no bruits Ext-no edema, chords, 2+ DP Neuro-grossly nonfocal  ECG -January 02, 2021-sinus rhythm, left ventricular hypertrophy, inferolateral T wave inversion, cannot rule out inferior infarct.  Personally reviewed and examined in the  Electrocardiogram June 25, 2021 personally reviewed and shows sinus rhythm with nonspecific T wave changes.  A/P  1 abnormal electrocardiogram-  2 palpitations-  3 hypertension-  4 hyperlipidemia-  5 peripheral vascular disease-  6 tobacco abuse-patient counseled on discontinuing.  Kirk Ruths, MD

## 2021-08-23 ENCOUNTER — Ambulatory Visit: Payer: Medicare HMO | Admitting: Cardiology

## 2021-12-20 ENCOUNTER — Other Ambulatory Visit: Payer: Self-pay | Admitting: Family Medicine

## 2021-12-20 DIAGNOSIS — E785 Hyperlipidemia, unspecified: Secondary | ICD-10-CM

## 2021-12-20 DIAGNOSIS — I70213 Atherosclerosis of native arteries of extremities with intermittent claudication, bilateral legs: Secondary | ICD-10-CM

## 2021-12-23 ENCOUNTER — Inpatient Hospital Stay: Admission: RE | Admit: 2021-12-23 | Payer: Medicare HMO | Source: Ambulatory Visit

## 2022-05-20 ENCOUNTER — Inpatient Hospital Stay: Admission: RE | Admit: 2022-05-20 | Payer: Medicare Other | Source: Ambulatory Visit

## 2023-01-13 ENCOUNTER — Other Ambulatory Visit: Payer: Self-pay | Admitting: Family Medicine

## 2023-01-13 ENCOUNTER — Encounter: Payer: Self-pay | Admitting: Family Medicine

## 2023-01-13 DIAGNOSIS — I739 Peripheral vascular disease, unspecified: Secondary | ICD-10-CM

## 2023-02-23 ENCOUNTER — Inpatient Hospital Stay: Admission: RE | Admit: 2023-02-23 | Payer: 59 | Source: Ambulatory Visit

## 2023-11-18 ENCOUNTER — Other Ambulatory Visit (HOSPITAL_COMMUNITY): Payer: Self-pay | Admitting: Cardiology

## 2023-11-18 DIAGNOSIS — R079 Chest pain, unspecified: Secondary | ICD-10-CM

## 2023-11-23 ENCOUNTER — Ambulatory Visit (HOSPITAL_COMMUNITY)
Admission: RE | Admit: 2023-11-23 | Discharge: 2023-11-23 | Disposition: A | Discharge status: Home / Self Care | Source: Ambulatory Visit | Attending: Cardiology | Admitting: Cardiology

## 2023-11-23 DIAGNOSIS — R079 Chest pain, unspecified: Secondary | ICD-10-CM | POA: Diagnosis present

## 2023-11-23 MED ORDER — REGADENOSON 0.4 MG/5ML IV SOLN
INTRAVENOUS | Status: AC
  Filled 2023-11-23: qty 5

## 2023-11-23 MED ORDER — REGADENOSON 0.4 MG/5ML IV SOLN
0.4000 mg | Freq: Once | INTRAVENOUS | Status: AC
  Administered 2023-11-23: 0.4 mg via INTRAVENOUS

## 2023-11-23 MED ORDER — TECHNETIUM TC 99M TETROFOSMIN IV KIT
10.0000 | PACK | Freq: Once | INTRAVENOUS | Status: AC | PRN
  Administered 2023-11-23: 10.31 via INTRAVENOUS

## 2023-11-23 MED ORDER — TECHNETIUM TC 99M TETROFOSMIN IV KIT
30.0000 | PACK | Freq: Once | INTRAVENOUS | Status: AC | PRN
  Administered 2023-11-23: 32.2 via INTRAVENOUS

## 2024-03-22 ENCOUNTER — Other Ambulatory Visit: Payer: Self-pay | Admitting: Family Medicine

## 2024-03-22 DIAGNOSIS — I70213 Atherosclerosis of native arteries of extremities with intermittent claudication, bilateral legs: Secondary | ICD-10-CM

## 2024-04-08 ENCOUNTER — Other Ambulatory Visit

## 2024-04-12 ENCOUNTER — Ambulatory Visit
Admission: RE | Admit: 2024-04-12 | Discharge: 2024-04-12 | Disposition: A | Discharge status: Home / Self Care | Source: Ambulatory Visit | Attending: Family Medicine | Admitting: Family Medicine

## 2024-04-12 DIAGNOSIS — I70213 Atherosclerosis of native arteries of extremities with intermittent claudication, bilateral legs: Secondary | ICD-10-CM
